# Patient Record
Sex: Female | Born: 2000 | Race: White | Hispanic: No | Marital: Single | Smoking: Never smoker
Health system: Southern US, Community
[De-identification: ages and names within clinical notes are randomized; demographics above are authoritative.]

## PROBLEM LIST (undated history)

## (undated) DIAGNOSIS — T7840XA Allergy, unspecified, initial encounter: Secondary | ICD-10-CM

## (undated) HISTORY — DX: Allergy, unspecified, initial encounter: T78.40XA

---
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
## 2022-03-09 IMAGING — MR MR LUMBAR SPINE W/O CM
4 of 5 series · 27 of 48 positions shown · non-contrast
Comparison: Radiography 04/19/2021. MRI 01/08/2021. Radiography
01/06/2021. Radiography 09/05/2018.

CLINICAL DATA: Bilateral low back pain radiating to both legs.
Question pars defects

EXAM:
MRI LUMBAR SPINE WITHOUT CONTRAST
TECHNIQUE: Multiplanar, multisequence MR imaging of the lumbar spine was
performed. No intravenous contrast was administered.

[Series 3: T2 · sagittal · 4.0mm · 1.09mm/px · 6 of 17 slices shown (1 of 2)]
[im 1/17]
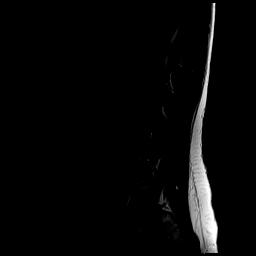
[im 4/17]
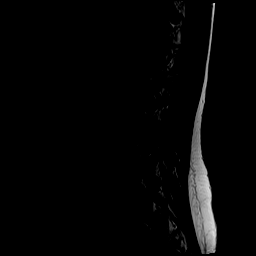
[im 7/17]
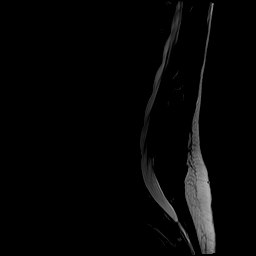
[im 10/17]
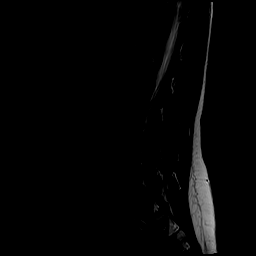
[im 13/17]
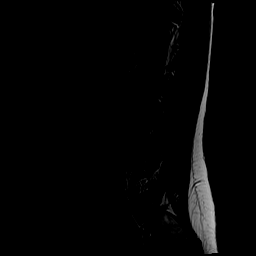
[im 17/17]
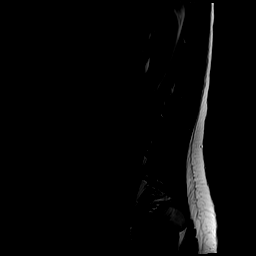

[Series 5: T1 · sagittal · 4.0mm · 1.09mm/px · 6 of 17 slices shown (1 of 2)]
[im 1/17]
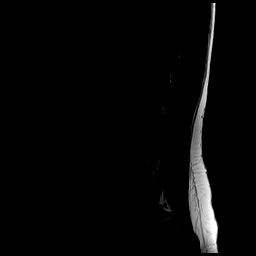
[im 4/17]
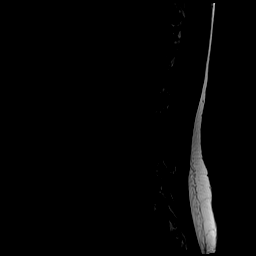
[im 7/17]
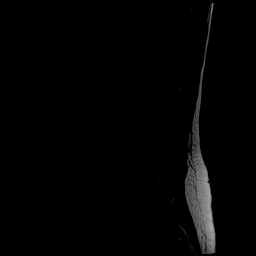
[im 10/17]
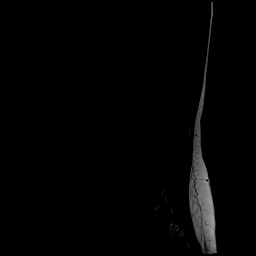
[im 13/17]
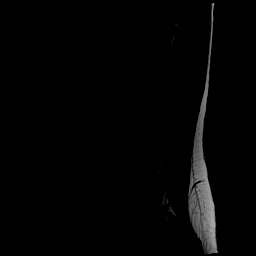
[im 17/17]
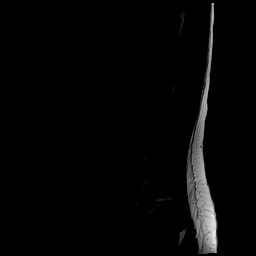

[Series 6: T2 · axial · 4.0mm · 0.39mm/px · z∈[-86,+137]mm · 9 of 42 slices shown (2 of 2)]
[im 1/42]
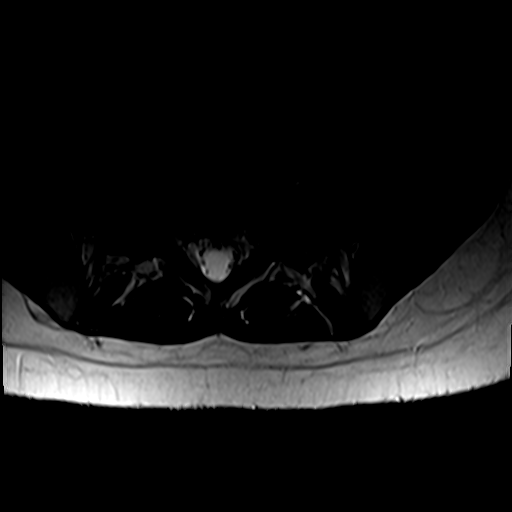
[im 6/42]
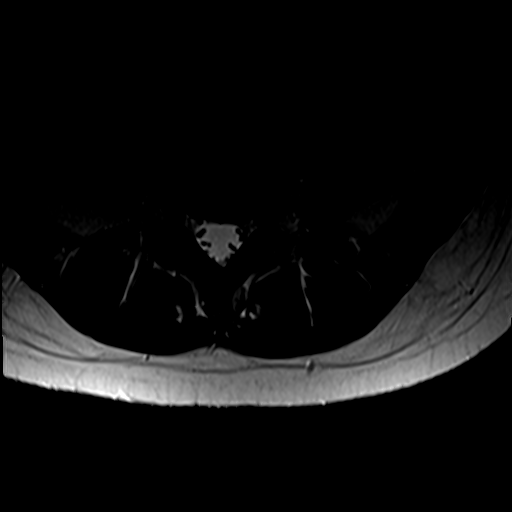
[im 12/42]
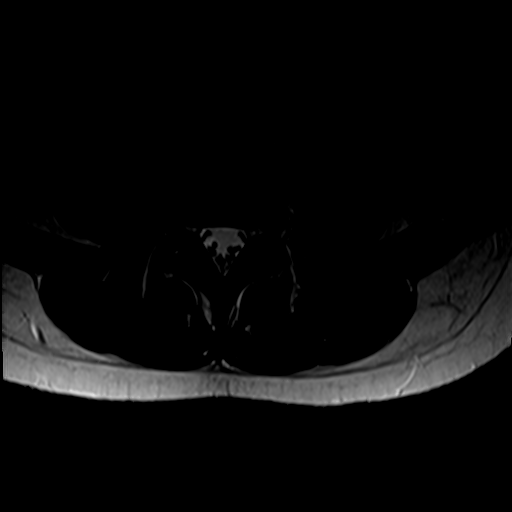
[im 18/42]
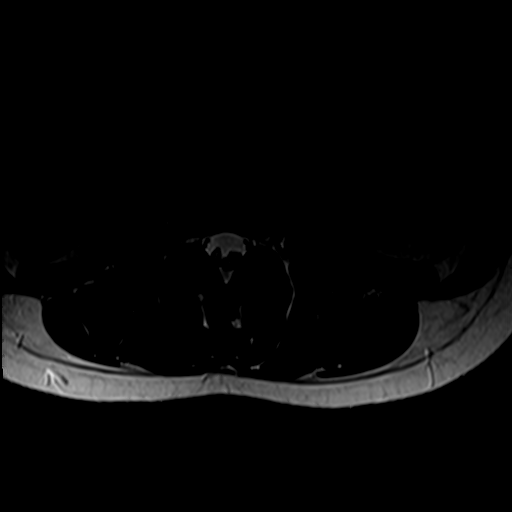
[im 21/42]
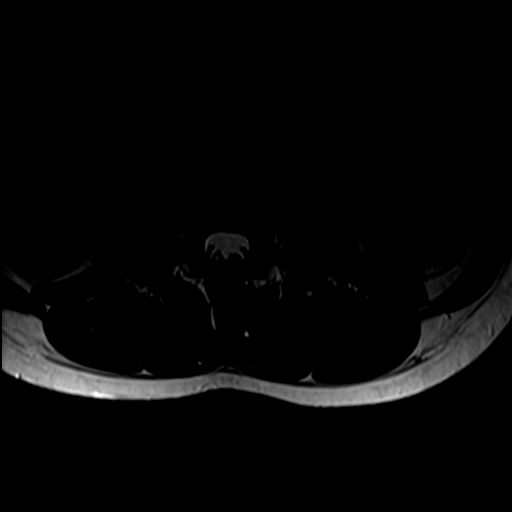
[im 24/42]
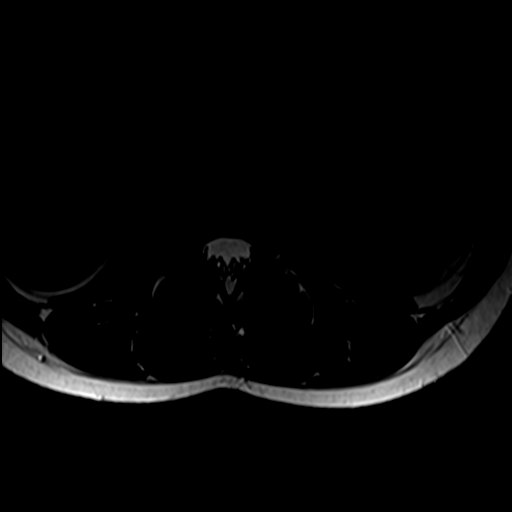
[im 30/42]
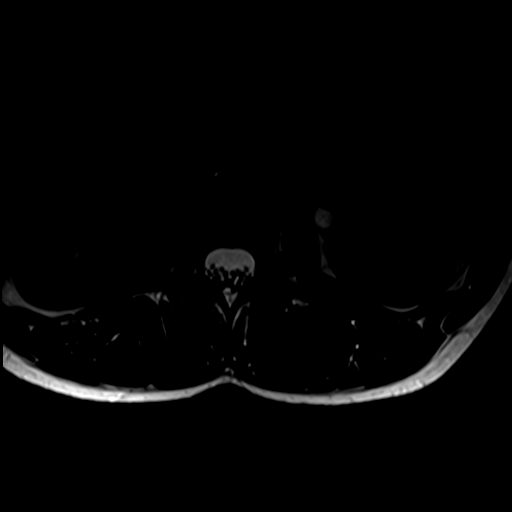
[im 36/42]
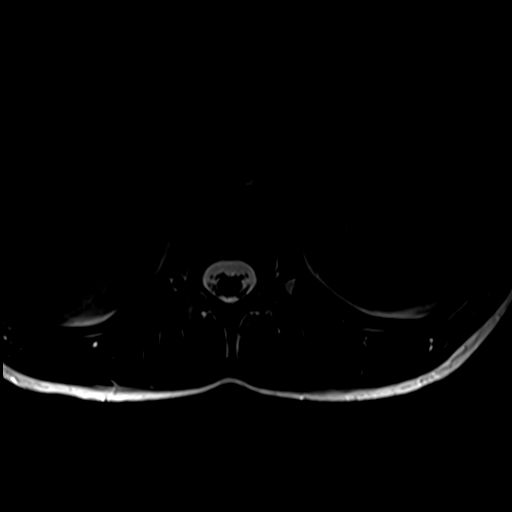
[im 42/42]
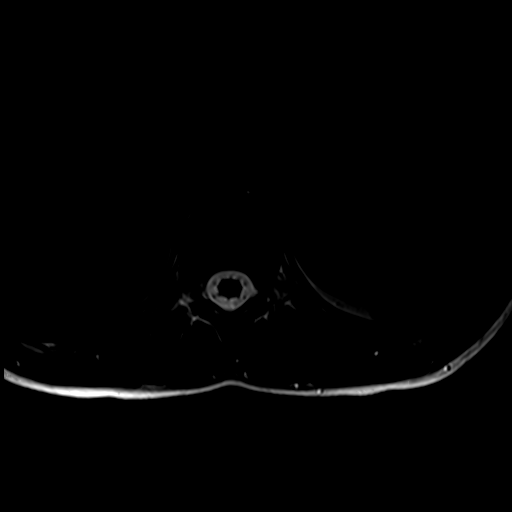

[Series 7: T1 · axial · 4.0mm · 0.39mm/px · z∈[-86,+108]mm · 6 of 42 slices shown (2 of 2)]
[im 1/42]
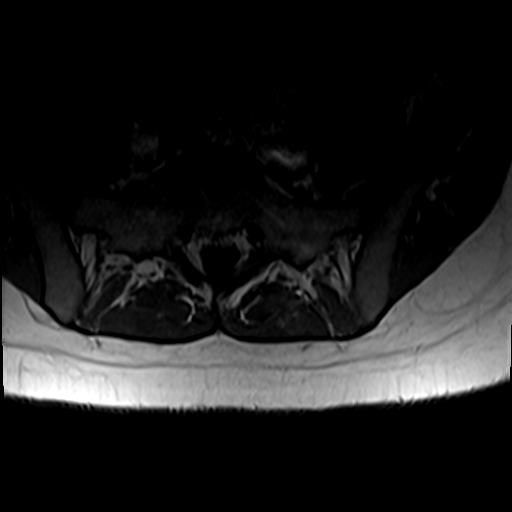
[im 6/42]
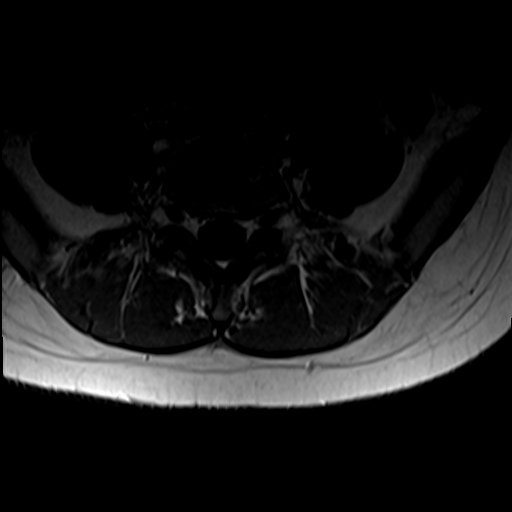
[im 12/42]
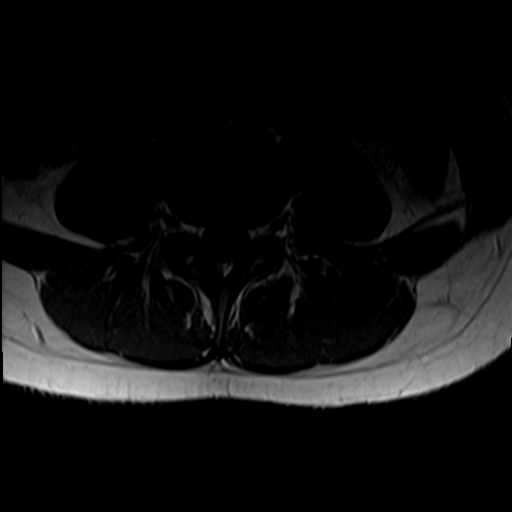
[im 18/42]
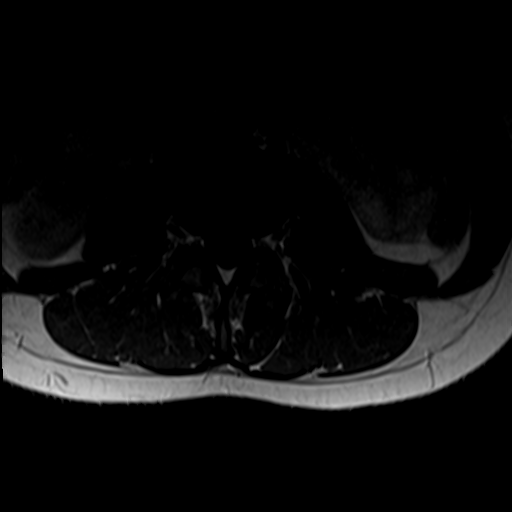
[im 21/42]
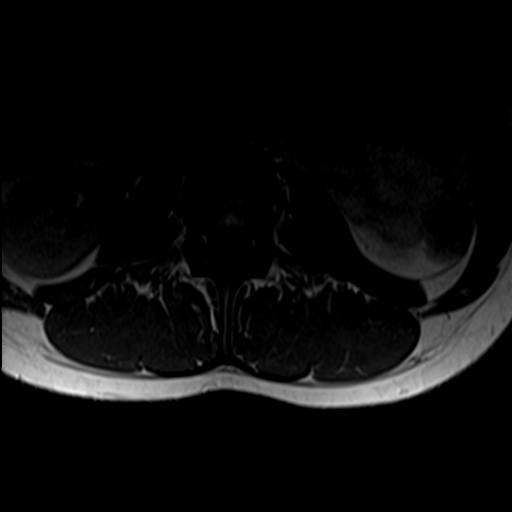
[im 36/42]
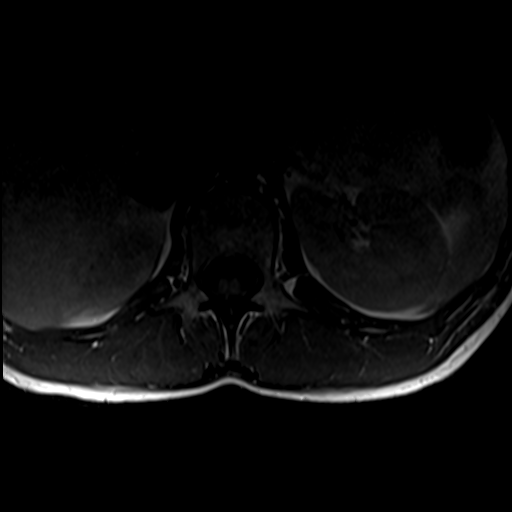

[27 of 48 positions shown; findings below may reference images not displayed]

FINDINGS: Segmentation:  5 lumbar type vertebral bodies.

Alignment:  Normal

Vertebrae: Healing pars defects bilaterally at L4. Minimal if any
residual marrow edema on the right compared to the previous study.
Resolution of surrounding soft tissue edema. Pars abnormality on the
left is incomplete compared to the right. I think there are old
healed pars injuries at L5 as seen previously.

Conus medullaris and cauda equina: Conus extends to the L1-2 level.
Conus and cauda equina appear normal.

Paraspinal and other soft tissues: Newly seen posterior paraspinous
muscle edema on the left find L5 and S1. The cause of this is
indeterminate.

Disc levels:

No abnormality at L3-4 or above.

L4-5: Healing/healed pars defects at L4 as described above. Very
minimal bulging of the disc. No canal or foraminal stenosis.

L5-S1: Healed pars injuries as described above. No disc abnormality.
No stenosis.
IMPRESSION: Since the last study, there is been considerable healing of the pars
injuries at L4 which were more extensive on the right than the left.
There is virtually no residual edema in this region presently.

Distant healed pars injuries at L5. No edema in that area. No change
since the prior exam.

Newly seen edema within the left posterior para spinous musculature
behind L5 and S1, etiology unknown.
